# Patient Record
Sex: Male | Born: 1990 | Race: Black or African American | Hispanic: No | Marital: Single
Health system: Southern US, Community
[De-identification: ages and names within clinical notes are randomized; demographics above are authoritative.]

---

## 2017-05-15 ENCOUNTER — Emergency Department (HOSPITAL_COMMUNITY): Payer: No Typology Code available for payment source

## 2017-05-15 ENCOUNTER — Emergency Department (HOSPITAL_COMMUNITY)
Admission: EM | Admit: 2017-05-15 | Discharge: 2017-05-15 | Disposition: A | Discharge status: Home / Self Care | Payer: No Typology Code available for payment source | Attending: Emergency Medicine | Admitting: Emergency Medicine

## 2017-05-15 ENCOUNTER — Encounter (HOSPITAL_COMMUNITY): Payer: Self-pay

## 2017-05-15 DIAGNOSIS — Y92410 Unspecified street and highway as the place of occurrence of the external cause: Secondary | ICD-10-CM | POA: Insufficient documentation

## 2017-05-15 DIAGNOSIS — Y999 Unspecified external cause status: Secondary | ICD-10-CM | POA: Insufficient documentation

## 2017-05-15 DIAGNOSIS — S42202A Unspecified fracture of upper end of left humerus, initial encounter for closed fracture: Secondary | ICD-10-CM | POA: Diagnosis not present

## 2017-05-15 DIAGNOSIS — R51 Headache: Secondary | ICD-10-CM | POA: Insufficient documentation

## 2017-05-15 DIAGNOSIS — Y939 Activity, unspecified: Secondary | ICD-10-CM | POA: Insufficient documentation

## 2017-05-15 DIAGNOSIS — S4992XA Unspecified injury of left shoulder and upper arm, initial encounter: Secondary | ICD-10-CM | POA: Diagnosis present

## 2017-05-15 DIAGNOSIS — M542 Cervicalgia: Secondary | ICD-10-CM | POA: Insufficient documentation

## 2017-05-15 MED ORDER — HYDROCODONE-ACETAMINOPHEN 5-325 MG PO TABS: 1.0000 | ORAL_TABLET | ORAL | 0 refills | Status: AC | PRN

## 2017-05-15 MED ORDER — MORPHINE SULFATE (PF) 2 MG/ML IV SOLN
4.0000 mg | Freq: Once | INTRAVENOUS | Status: AC
  Administered 2017-05-15: 4 mg via INTRAVENOUS
  Filled 2017-05-15: qty 2

## 2017-05-15 MED ORDER — MORPHINE SULFATE (PF) 10 MG/ML IV SOLN
8.0000 mg | Freq: Once | INTRAVENOUS | Status: AC
  Administered 2017-05-15: 8 mg via INTRAVENOUS
  Filled 2017-05-15: qty 1

## 2017-05-15 MED ORDER — HYDROCODONE-ACETAMINOPHEN 5-325 MG PO TABS
2.0000 | ORAL_TABLET | Freq: Once | ORAL | Status: AC
  Administered 2017-05-15: 2 via ORAL
  Filled 2017-05-15: qty 2

## 2017-05-15 NOTE — ED Notes (Signed)
Patient transported to x-ray. ?
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
## 2017-05-16 ENCOUNTER — Telehealth (INDEPENDENT_AMBULATORY_CARE_PROVIDER_SITE_OTHER): Payer: Self-pay
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
## 2017-05-19 NOTE — Telephone Encounter (Signed)
appt 05/20/17

## 2017-05-19 NOTE — Telephone Encounter (Signed)
Called pt to set up appt. His voice mail has not been set up. Will try back at another time to schedule appt.

## 2017-05-20 ENCOUNTER — Ambulatory Visit (INDEPENDENT_AMBULATORY_CARE_PROVIDER_SITE_OTHER): Payer: Self-pay | Admitting: Orthopaedic Surgery
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
